# Patient Record
Sex: Female | Born: 1985
Health system: Southern US, Community
[De-identification: ages and names within clinical notes are randomized; demographics above are authoritative.]

## PROBLEM LIST (undated history)

## (undated) DIAGNOSIS — D270 Benign neoplasm of right ovary: Secondary | ICD-10-CM

## (undated) DIAGNOSIS — K5900 Constipation, unspecified: Secondary | ICD-10-CM

## (undated) DIAGNOSIS — Z8742 Personal history of other diseases of the female genital tract: Secondary | ICD-10-CM

## (undated) DIAGNOSIS — G4700 Insomnia, unspecified: Secondary | ICD-10-CM

## (undated) DIAGNOSIS — E785 Hyperlipidemia, unspecified: Secondary | ICD-10-CM

## (undated) DIAGNOSIS — R519 Headache, unspecified: Secondary | ICD-10-CM

## (undated) DIAGNOSIS — Z87898 Personal history of other specified conditions: Secondary | ICD-10-CM

## (undated) HISTORY — PX: BREAST SURGERY: SHX581

---

## 2015-06-11 HISTORY — PX: OTHER SURGICAL HISTORY: SHX169

## 2019-12-08 ENCOUNTER — Other Ambulatory Visit: Payer: Self-pay | Admitting: Chiropractor

## 2019-12-08 ENCOUNTER — Ambulatory Visit
Admission: RE | Admit: 2019-12-08 | Discharge: 2019-12-08 | Disposition: A | Discharge status: Home / Self Care | Payer: Managed Care, Other (non HMO) | Source: Ambulatory Visit | Attending: Chiropractor | Admitting: Chiropractor

## 2019-12-08 ENCOUNTER — Other Ambulatory Visit: Payer: Self-pay

## 2019-12-08 DIAGNOSIS — M25521 Pain in right elbow: Secondary | ICD-10-CM | POA: Diagnosis not present

## 2019-12-08 DIAGNOSIS — R52 Pain, unspecified: Secondary | ICD-10-CM

## 2019-12-08 DIAGNOSIS — M545 Low back pain: Secondary | ICD-10-CM | POA: Diagnosis not present

## 2019-12-08 DIAGNOSIS — M546 Pain in thoracic spine: Secondary | ICD-10-CM | POA: Diagnosis not present

## 2019-12-08 DIAGNOSIS — Y929 Unspecified place or not applicable: Secondary | ICD-10-CM | POA: Diagnosis not present

## 2020-11-24 ENCOUNTER — Other Ambulatory Visit: Payer: Self-pay

## 2020-11-24 ENCOUNTER — Emergency Department
Admission: EM | Admit: 2020-11-24 | Discharge: 2020-11-24 | Disposition: A | Discharge status: Home / Self Care | Payer: 59 | Attending: Emergency Medicine | Admitting: Emergency Medicine

## 2020-11-24 ENCOUNTER — Emergency Department: Payer: 59

## 2020-11-24 ENCOUNTER — Encounter: Payer: Self-pay | Admitting: Emergency Medicine

## 2020-11-24 DIAGNOSIS — F419 Anxiety disorder, unspecified: Secondary | ICD-10-CM

## 2020-11-24 DIAGNOSIS — R519 Headache, unspecified: Secondary | ICD-10-CM | POA: Diagnosis not present

## 2020-11-24 DIAGNOSIS — R079 Chest pain, unspecified: Secondary | ICD-10-CM

## 2020-11-24 DIAGNOSIS — R072 Precordial pain: Secondary | ICD-10-CM | POA: Insufficient documentation

## 2020-11-24 LAB — COMPREHENSIVE METABOLIC PANEL
ALT: 12 U/L (ref 0–44)
AST: 19 U/L (ref 15–41)
Albumin: 3.8 g/dL (ref 3.5–5.0)
Alkaline Phosphatase: 45 U/L (ref 38–126)
Anion gap: 12 (ref 5–15)
BUN: 9 mg/dL (ref 6–20)
CO2: 20 mmol/L — ABNORMAL LOW (ref 22–32)
Calcium: 8.9 mg/dL (ref 8.9–10.3)
Chloride: 106 mmol/L (ref 98–111)
Creatinine, Ser: 0.72 mg/dL (ref 0.44–1.00)
GFR, Estimated: >60 mL/min (ref >60)
Glucose, Bld: 109 mg/dL — ABNORMAL HIGH (ref 70–99)
Potassium: 3.8 mmol/L (ref 3.5–5.1)
Sodium: 138 mmol/L (ref 135–145)
Total Bilirubin: 0.6 mg/dL (ref 0.3–1.2)
Total Protein: 7.8 g/dL (ref 6.5–8.1)

## 2020-11-24 LAB — CBC WITH DIFFERENTIAL/PLATELET
Abs Immature Granulocytes: 0.01 10*3/uL (ref 0.00–0.07)
Basophils Absolute: 0 10*3/uL (ref 0.0–0.1)
Basophils Relative: 0 %
Eosinophils Absolute: 0.1 10*3/uL (ref 0.0–0.5)
Eosinophils Relative: 2 %
HCT: 37.7 % (ref 36.0–46.0)
Hemoglobin: 12.3 g/dL (ref 12.0–15.0)
Immature Granulocytes: 0 %
Lymphocytes Relative: 31 %
Lymphs Abs: 1.6 10*3/uL (ref 0.7–4.0)
MCH: 26.7 pg (ref 26.0–34.0)
MCHC: 32.6 g/dL (ref 30.0–36.0)
MCV: 82 fL (ref 80.0–100.0)
Monocytes Absolute: 0.3 10*3/uL (ref 0.1–1.0)
Monocytes Relative: 6 %
Neutro Abs: 3.1 10*3/uL (ref 1.7–7.7)
Neutrophils Relative %: 61 %
Platelets: 299 10*3/uL (ref 150–400)
RBC: 4.6 MIL/uL (ref 3.87–5.11)
RDW: 13.3 % (ref 11.5–15.5)
WBC: 5.1 10*3/uL (ref 4.0–10.5)
nRBC: 0 % (ref 0.0–0.2)

## 2020-11-24 LAB — TROPONIN I (HIGH SENSITIVITY)
Troponin I (High Sensitivity): <2 ng/L (ref <18)
Troponin I (High Sensitivity): <2 ng/L (ref <18)

## 2020-11-24 MED ORDER — ACETAMINOPHEN 500 MG PO TABS
ORAL_TABLET | ORAL | Status: AC
  Filled 2020-11-24: qty 2

## 2020-11-24 MED ORDER — IBUPROFEN 400 MG PO TABS
400.0000 mg | ORAL_TABLET | Freq: Once | ORAL | Status: AC
  Administered 2020-11-24: 400 mg via ORAL
  Filled 2020-11-24: qty 1

## 2020-11-24 MED ORDER — ACETAMINOPHEN 500 MG PO TABS
1000.0000 mg | ORAL_TABLET | Freq: Once | ORAL | Status: AC
  Administered 2020-11-24: 1000 mg via ORAL

## 2020-11-24 NOTE — ED Provider Notes (Signed)
[facility] Emergency Department Provider Note  ____________________________________________   Event Date/Time   First MD Initiated Contact with Patient 11/24/20 1209     (approximate)  I have reviewed the triage vital signs and the nursing notes.   HISTORY  Chief Complaint Chest Pain   HPI Dakiyah Juhasz is a 34 y.o. female without significant past medical history who presents for assessment of approximately 4 days of some mild headache and some substernal and left-sided chest tightness.  Patient states she is under a lot of stress because she is going to exam issues with her family and has a family history of heart disease and wanted to get her heart checked out.  She states she tried some CBD but this is not helped too much.  She denies any earache, vision changes, vertigo, cough, shortness of breath, vomiting, diarrhea, dysuria, rash abdominal pain, back pain, extremity pain, urinary symptoms or other acute complaints.  Denies tobacco abuse, EtOH use, illicit drug use.  No personal episodes.  No alleviating aggravating factors.         History reviewed. No pertinent past medical history.  There are no problems to display for this patient.   History reviewed. No pertinent surgical history.  Prior to Admission medications   Not on File    Allergies Patient has no known allergies.  No family history on file.  Social History Social History   Tobacco Use  . Smoking status: Never Smoker  . Smokeless tobacco: Never Used  Vaping Use  . Vaping Use: Never used    Review of Systems  Review of Systems  Constitutional: Negative for chills and fever.  HENT: Positive for congestion. Negative for sore throat.   Eyes: Negative for pain.  Respiratory: Negative for cough and stridor.   Cardiovascular: Positive for chest pain.  Gastrointestinal: Negative for vomiting.  Genitourinary: Negative for dysuria.  Musculoskeletal: Negative for myalgias.   Skin: Negative for rash.  Neurological: Positive for headaches. Negative for seizures and loss of consciousness.  Psychiatric/Behavioral: Negative for suicidal ideas. The patient is nervous/anxious.   All other systems reviewed and are negative.     ____________________________________________   PHYSICAL EXAM:  VITAL SIGNS: ED Triage Vitals  Enc Vitals Group     BP 11/24/20 0436 (!) 143/90     Pulse Rate 11/24/20 0436 82     Resp 11/24/20 0436 18     Temp 11/24/20 0436 98 F (36.7 C)     Temp Source 11/24/20 0436 Oral     SpO2 11/24/20 0436 100 %     Weight 11/24/20 0432 165 lb (74.8 kg)     Height 11/24/20 0432 5' (1.524 m)     Head Circumference --      Peak Flow --      Pain Score 11/24/20 0432 6     Pain Loc --      Pain Edu? --      Excl. in GC? --    Vitals:   11/24/20 1110 11/24/20 1217  BP: (!) 143/84 (!) 145/97  Pulse: 84 96  Resp: 19 19  Temp: 98 F (36.7 C)   SpO2: 99% 100%   Physical Exam Vitals and nursing note reviewed.  Constitutional:      General: She is not in acute distress.    Appearance: She is well-developed and well-nourished.  HENT:     Head: Normocephalic and atraumatic.     Right Ear: External ear normal.  Left Ear: External ear normal.     Nose: Nose normal.     Mouth/Throat:     Mouth: Mucous membranes are moist.  Eyes:     Conjunctiva/sclera: Conjunctivae normal.  Cardiovascular:     Rate and Rhythm: Normal rate and regular rhythm.     Heart sounds: No murmur heard.   Pulmonary:     Effort: Pulmonary effort is normal. No respiratory distress.     Breath sounds: Normal breath sounds.  Abdominal:     Palpations: Abdomen is soft.     Tenderness: There is no abdominal tenderness.  Musculoskeletal:        General: No edema.     Cervical back: Neck supple.     Right lower leg: No edema.     Left lower leg: No edema.  Skin:    General: Skin is warm and dry.     Capillary Refill: Capillary refill takes less than 2  seconds.  Neurological:     Mental Status: She is alert and oriented to person, place, and time.  Psychiatric:        Mood and Affect: Mood is anxious.        Thought Content: Thought content does not include homicidal or suicidal ideation.     Cranial nerves II to XII grossly intact.  Patient symmetric strength in extremities and is able to ambulate with steady gait unassisted.  ____________________________________________   LABS (all labs ordered are listed, but only abnormal results are displayed)  Labs Reviewed  COMPREHENSIVE METABOLIC PANEL - Abnormal; Notable for the following components:      Result Value   CO2 20 (*)    Glucose, Bld 109 (*)    All other components within normal limits  CBC WITH DIFFERENTIAL/PLATELET  TROPONIN I (HIGH SENSITIVITY)  TROPONIN I (HIGH SENSITIVITY)   ____________________________________________  EKG  Sinus rhythm with a ventricular rate of 92, normal axis, unremarkable intervals, no evidence of acute ischemia or significant underlying arrhythmia ____________________________________________  RADIOLOGY  ED MD interpretation: No focal consolidation, pneumothorax, large edema, overt effusion, or acute intrathoracic process  Official radiology report(s): DG Chest 2 View  Result Date: 11/24/2020 CLINICAL DATA:  34 year old female with chest pain, headache for several days. EXAM: CHEST - 2 VIEW COMPARISON:  Thoracic spine radiographs 12/08/2019. FINDINGS: Mild thoracolumbar scoliosis appears stable since last year. Normal lung volumes and mediastinal contours. Visualized tracheal air column is within normal limits. EKG button artifact in the right upper chest. Both lungs appear clear. No pneumothorax or pleural effusion. Negative visible bowel gas pattern. No acute osseous abnormality identified. IMPRESSION: Negative.  No cardiopulmonary abnormality. Electronically Signed   By: Odessa Fleming M.D.   On: 11/24/2020 05:30     ____________________________________________   PROCEDURES  Procedure(s) performed (including Critical Care):  Procedures   ____________________________________________   INITIAL IMPRESSION / ASSESSMENT AND PLAN / ED COURSE        Patient presents for assessment of some chest pain, headache, congestion, and anxiety as described above.  On arrival she is hypertensive with a BP of 145/97 with stable vital signs on room air.  Differential) not limited to ACS, PE, pneumonia, anemia, pericarditis, hepatitis, GERD, and chest wall pain.  No findings on history or exam to suggest meningitis (infection in the head or neck i.e. no fever, neck stiffness, elevated white blood cell, or abnormal findings on exam.  ECG shows no evidence of arrhythmia or ischemia.  Low suspicion for ACS given 2 nonelevated troponins obtained  over 2 hours.  Chest x-ray shows no evidence of pneumonia or other acute thoracic process.  Troponins not consistent with myocarditis.  CBC is unremarkable for evidence of acute anemia or leukocytosis.  BMP shows no significant metabolic derangements.  Impression is possible viral syndrome versus chest wall pain have a low suspicion for life-threatening etiology at this time.  Advised patient to take any Tylenol ibuprofen   ____________________________________________   FINAL CLINICAL IMPRESSION(S) / ED DIAGNOSES  Final diagnoses:  Chest pain, unspecified type  Anxiety    Medications  acetaminophen (TYLENOL) 500 MG tablet (  Not Given 11/24/20 1231)  acetaminophen (TYLENOL) tablet 1,000 mg (1,000 mg Oral Given 11/24/20 1229)  ibuprofen (ADVIL) tablet 400 mg (400 mg Oral Given 11/24/20 1230)     ED Discharge Orders    None       Note:  This document was prepared using Dragon voice recognition software and may include unintentional dictation errors.   Gilles Chiquito, MD 11/24/20 1259

## 2020-11-24 NOTE — ED Triage Notes (Signed)
Patient ambulatory to triage with steady gait, without difficulty or distress noted; pt reports mid CP, nonradiating accomp by HA for several days

## 2020-11-24 NOTE — ED Notes (Addendum)
Pt in with HA, stress, and chest discomfort. Reports wanted to get checked out because of a family history with cardiac issues. Pt alert and in NAD; resting calmly in bed. Somewhat tearful; pt admits being mildly anxious. Skin dry. Resp reg/unlabored. Reports attempted to calm down with CBD but had no relief.
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
## 2023-04-26 ENCOUNTER — Other Ambulatory Visit: Payer: Self-pay | Admitting: Obstetrics and Gynecology

## 2023-04-27 ENCOUNTER — Encounter
Admission: RE | Admit: 2023-04-27 | Discharge: 2023-04-27 | Disposition: A | Discharge status: Home / Self Care | Payer: 59 | Source: Ambulatory Visit | Attending: Obstetrics and Gynecology | Admitting: Obstetrics and Gynecology

## 2023-04-27 VITALS — Ht 60.0 in | Wt 165.3 lb

## 2023-04-27 DIAGNOSIS — Z01818 Encounter for other preprocedural examination: Secondary | ICD-10-CM

## 2023-04-27 HISTORY — DX: Personal history of other diseases of the female genital tract: Z87.42

## 2023-04-27 HISTORY — DX: Hyperlipidemia, unspecified: E78.5

## 2023-04-27 HISTORY — DX: Constipation, unspecified: K59.00

## 2023-04-27 HISTORY — DX: Benign neoplasm of right ovary: D27.0

## 2023-04-27 HISTORY — DX: Personal history of other specified conditions: Z87.898

## 2023-04-27 HISTORY — DX: Insomnia, unspecified: G47.00

## 2023-04-27 HISTORY — DX: Headache, unspecified: R51.9

## 2023-04-27 NOTE — Patient Instructions (Signed)
Your procedure is scheduled on:05-03-23 Thursday Report to the Registration Desk on the 1st floor of the [facility].Then proceed to the 2nd floor Surgery Desk To find out your arrival time, please call [phone] between 1PM - 3PM on:05-02-23 Wednesday If your arrival time is 6:00 am, do not arrive before that time as the [facility] entrance doors do not open until 6:00 am.  REMEMBER: Instructions that are not followed completely may result in serious medical risk, up to and including death; or upon the discretion of your surgeon and anesthesiologist your surgery may need to be rescheduled.  Do not eat food after midnight the night before surgery.  No gum chewing or hard candies.  You may however, drink CLEAR liquids up to 2 hours before you are scheduled to arrive for your surgery. Do not drink anything within 2 hours of your scheduled arrival time.  Clear liquids include: - water  - apple juice without pulp - gatorade (not RED colors) - black coffee or tea (Do NOT add milk or creamers to the coffee or tea) Do NOT drink anything that is not on this list.  One week prior to surgery: Stop Anti-inflammatories (NSAIDS) such as Advil, Aleve, Ibuprofen, Motrin, Naproxen, Naprosyn and Aspirin based products such as Excedrin, Goody's Powder, BC Powder.You may however, take Tylenol if needed for pain up until the day of surgery. Stop ANY OVER THE COUNTER supplements/vitamins NOW (04-27-23) until after surgery.  Do NOT take any medication the day of surgery  No Alcohol for 24 hours before or after surgery.  No Smoking including e-cigarettes for 24 hours before surgery.  No chewable tobacco products for at least 6 hours before surgery.  No nicotine patches on the day of surgery.  Do not use any "recreational" drugs for at least a week (preferably 2 weeks) before your surgery.  Please be advised that the combination of cocaine and anesthesia may have negative outcomes, up to and including  death. If you test positive for cocaine, your surgery will be cancelled.  On the morning of surgery brush your teeth with toothpaste and water, you may rinse your mouth with mouthwash if you wish. Do not swallow any toothpaste or mouthwash.  Use CHG Soap as directed on instruction sheet.  Do not wear jewelry, make-up, hairpins, clips or nail polish.  Do not wear lotions, powders, or perfumes.   Do not shave body hair from the neck down 48 hours before surgery.  Contact lenses, hearing aids and dentures may not be worn into surgery.  Do not bring valuables to the hospital. [facility] is not responsible for any missing/lost belongings or valuables.   Notify your doctor if there is any change in your medical condition (cold, fever, infection).  Wear comfortable clothing (specific to your surgery type) to the hospital.  After surgery, you can help prevent lung complications by doing breathing exercises.  Take deep breaths and cough every 1-2 hours. Your doctor may order a device called an Incentive Spirometer to help you take deep breaths. When coughing or sneezing, hold a pillow firmly against your incision with both hands. This is called "splinting." Doing this helps protect your incision. It also decreases belly discomfort.  If you are being admitted to the hospital overnight, leave your suitcase in the car. After surgery it may be brought to your room.  In case of increased patient census, it may be necessary for you, the patient, to continue your postoperative care in the Same Day Surgery  department.  If you are being discharged the day of surgery, you will not be allowed to drive home. You will need a responsible individual to drive you home and stay with you for 24 hours after surgery.   If you are taking public transportation, you will need to have a responsible individual with you.  Please call the Pre-admissions Testing Dept. at [phone] if you have any questions about  these instructions.  Surgery Visitation Policy:  Patients having surgery or a procedure may have two visitors.  Children under the age of 78 must have an adult with them who is not the patient.     Preparing for Surgery with CHLORHEXIDINE GLUCONATE (CHG) Soap  Chlorhexidine Gluconate (CHG) Soap  o An antiseptic cleaner that kills germs and bonds with the skin to continue killing germs even after washing  o Used for showering the night before surgery and morning of surgery  Before surgery, you can play an important role by reducing the number of germs on your skin.  CHG (Chlorhexidine gluconate) soap is an antiseptic cleanser which kills germs and bonds with the skin to continue killing germs even after washing.  Please do not use if you have an allergy to CHG or antibacterial soaps. If your skin becomes reddened/irritated stop using the CHG.  1. Shower the NIGHT BEFORE SURGERY and the MORNING OF SURGERY with CHG soap.  2. If you choose to wash your hair, wash your hair first as usual with your normal shampoo.  3. After shampooing, rinse your hair and body thoroughly to remove the shampoo.  4. Use CHG as you would any other liquid soap. You can apply CHG directly to the skin and wash gently with a scrungie or a clean washcloth.  5. Apply the CHG soap to your body only from the neck down. Do not use on open wounds or open sores. Avoid contact with your eyes, ears, mouth, and genitals (private parts). Wash face and genitals (private parts) with your normal soap.  6. Wash thoroughly, paying special attention to the area where your surgery will be performed.  7. Thoroughly rinse your body with warm water.  8. Do not shower/wash with your normal soap after using and rinsing off the CHG soap.  9. Pat yourself dry with a clean towel.  10. Wear clean pajamas to bed the night before surgery.  12. Place clean sheets on your bed the night of your first shower and do not sleep with  pets.  13. Shower again with the CHG soap on the day of surgery prior to arriving at the hospital.  14. Do not apply any deodorants/lotions/powders.  15. Please wear clean clothes to the hospital.

## 2023-04-27 NOTE — H&P (Signed)
Chief Complaint:   Beth Apple Chaneka Pittman is a 37 y.o. female presenting with acute signficant pelvic pain    History of Present Illness: She was recently seen by her PCP for back pain that radiates to her pelvic area. She was sent for a CT and was confirmed to have a mature teratoma on her right ovary that measures 7 cm. She was then referred to GYN for further workup/evaluation. She reports that she has been taking Ibuprofen around the clock daily for the past few weeks without relief.  She is tearful today in clinic during her physical d/t to concern of diagnosis and pain.  Uterus: Anteverted Uterus= 9.47 x 3.73 x 5.06cm   Endometrium= 8.86mm   Right ovary appears to contain a dermoid, cannot visualize ovarian tissue - 6.97 x 5.76 x 8.28cm - superior to uterine position and causing mass effect to the left pelvis - cannot r/o ovarian torsion  Left ovary appears wnl Rt/Lt Adnexa appear wnl      Past Medical History:  has a past medical history of Constipation, Headache, History of abnormal cervical Pap smear, History of palpitations, HLD (hyperlipidemia), Insomnia, and Teratoma of ovary, right.  Past Surgical History:  has a past surgical history that includes Breast surgery; Cesarean section; and REDUCTION MAMMAPLASTY Bilateral (06/2015). Family History: family history is not on file. Social History:  reports that she has never smoked. She has never used smokeless tobacco. She reports current alcohol use. She reports that she does not use drugs. OB/GYN History:  OB History   No obstetric history on file.    Allergies: has No Known Allergies. Medications:No current facility-administered medications for this encounter. No current outpatient medications on file.  Review of Systems: No SOB, no palpitations or chest pain, no new lower extremity edema, no nausea or vomiting or bowel or bladder complaints. See HPI for gyn specific ROS.   Exam:   LMP 04/02/2023 (Exact Date)    General: Patient is well-groomed, well-nourished, appears stated age in no acute distress   HEENT: head is atraumatic and normocephalic, trachea is midline, neck is supple with no palpable nodules   CV: Regular rhythm and normal heart rate, no murmur   Pulm: Clear to auscultation throughout lung fields with no wheezing, crackles, or rhonchi. No increased work of breathing  Abdomen: soft , no mass, non-tender, no rebound tenderness, no hepatomegaly  Pelvic: tanner stage 5 ,   External genitalia: vulva /labia no lesions  Urethra: no prolapse  Vagina: normal physiologic d/c, laxity in vaginal walls  Cervix: no lesions, no cervical motion tenderness, good descent  Uterus: normal size shape and contour, non-tender  Adnexa: no mass,  non-tender    Rectovaginal: External wnl   Impression:   There were no encounter diagnoses.    Plan:    Patient returns for a preoperative discussion regarding her plans to proceed with surgical treatment of her dermoid cyst by laparoscopic bilateral ovarian cystectomy and possible R oophorectomy procedure.  We may perform a cystoscopy to evaluate the urinary tract after the procedure, if surgically indicated for uro tract integrity.   The patient and I discussed the technical aspects of the procedure including the potential for risks and complications.  These include but are not limited to the risk of infection requiring post-operative antibiotics or further procedures.  We talked about the risk of injury to adjacent organs including bladder, bowel, ureter, blood vessels or nerves.  We talked about the need to convert to an open incision.  We talked about the possible need for blood transfusion.  We talked about postop complications such as thromboembolic or cardiopulmonary complications.  All of her questions were answered.  Her preoperative exam was completed and the appropriate consents were signed. She is scheduled to undergo this procedure in the near  future.  Return for postop    [name], MD

## 2023-04-30 ENCOUNTER — Encounter
Admission: RE | Admit: 2023-04-30 | Discharge: 2023-04-30 | Disposition: A | Discharge status: Home / Self Care | Payer: 59 | Source: Ambulatory Visit | Attending: Obstetrics and Gynecology | Admitting: Obstetrics and Gynecology

## 2023-04-30 DIAGNOSIS — Z01818 Encounter for other preprocedural examination: Secondary | ICD-10-CM

## 2023-04-30 DIAGNOSIS — Z01812 Encounter for preprocedural laboratory examination: Secondary | ICD-10-CM | POA: Diagnosis present

## 2023-04-30 LAB — TYPE AND SCREEN
ABO/RH(D): O POS
Antibody Screen: NEGATIVE

## 2023-05-02 MED ORDER — LACTATED RINGERS IV SOLN: INTRAVENOUS | Status: DC

## 2023-05-02 MED ORDER — ACETAMINOPHEN 500 MG PO TABS
1000.0000 mg | ORAL_TABLET | ORAL | Status: AC
  Administered 2023-05-03: 1000 mg via ORAL

## 2023-05-02 MED ORDER — POVIDONE-IODINE 10 % EX SWAB
2.0000 | Freq: Once | CUTANEOUS | Status: AC
  Administered 2023-05-03: 2 via TOPICAL

## 2023-05-02 MED ORDER — ORAL CARE MOUTH RINSE: 15.0000 mL | Freq: Once | OROMUCOSAL | Status: DC

## 2023-05-02 MED ORDER — CHLORHEXIDINE GLUCONATE 0.12 % MT SOLN: 15.0000 mL | Freq: Once | OROMUCOSAL | Status: DC

## 2023-05-02 MED ORDER — FAMOTIDINE 20 MG PO TABS
20.0000 mg | ORAL_TABLET | Freq: Once | ORAL | Status: AC
  Administered 2023-05-03: 20 mg via ORAL

## 2023-05-02 MED ORDER — GABAPENTIN 300 MG PO CAPS
300.0000 mg | ORAL_CAPSULE | ORAL | Status: AC
  Administered 2023-05-03: 300 mg via ORAL

## 2023-05-03 ENCOUNTER — Encounter: Payer: Self-pay | Admitting: Obstetrics and Gynecology

## 2023-05-03 ENCOUNTER — Ambulatory Visit: Payer: 59

## 2023-05-03 ENCOUNTER — Other Ambulatory Visit: Payer: Self-pay

## 2023-05-03 ENCOUNTER — Encounter
Admission: RE | Disposition: A | Discharge status: Home / Self Care | Payer: Self-pay | Source: Home / Self Care | Attending: Obstetrics and Gynecology

## 2023-05-03 ENCOUNTER — Ambulatory Visit
Admission: RE | Admit: 2023-05-03 | Discharge: 2023-05-03 | Disposition: A | Discharge status: Home / Self Care | Payer: 59 | Attending: Obstetrics and Gynecology | Admitting: Obstetrics and Gynecology

## 2023-05-03 DIAGNOSIS — D270 Benign neoplasm of right ovary: Secondary | ICD-10-CM | POA: Insufficient documentation

## 2023-05-03 DIAGNOSIS — E785 Hyperlipidemia, unspecified: Secondary | ICD-10-CM | POA: Diagnosis not present

## 2023-05-03 DIAGNOSIS — R102 Pelvic and perineal pain: Secondary | ICD-10-CM | POA: Diagnosis present

## 2023-05-03 DIAGNOSIS — Z01818 Encounter for other preprocedural examination: Secondary | ICD-10-CM

## 2023-05-03 HISTORY — PX: ROBOTIC ASSISTED LAPAROSCOPIC OVARIAN CYSTECTOMY: SHX6081

## 2023-05-03 LAB — POCT PREGNANCY, URINE: Preg Test, Ur: NEGATIVE

## 2023-05-03 LAB — ABO/RH: ABO/RH(D): O POS

## 2023-05-03 SURGERY — EXCISION, CYST, OVARY, ROBOT-ASSISTED, LAPAROSCOPIC
Anesthesia: General | Laterality: Bilateral

## 2023-05-03 MED ORDER — SUGAMMADEX SODIUM 200 MG/2ML IV SOLN
INTRAVENOUS | Status: DC | PRN
  Administered 2023-05-03: 200 mg via INTRAVENOUS

## 2023-05-03 MED ORDER — KETOROLAC TROMETHAMINE 30 MG/ML IJ SOLN
INTRAMUSCULAR | Status: DC | PRN
  Administered 2023-05-03: 30 mg via INTRAVENOUS

## 2023-05-03 MED ORDER — LIDOCAINE HCL (CARDIAC) PF 100 MG/5ML IV SOSY
PREFILLED_SYRINGE | INTRAVENOUS | Status: DC | PRN
  Administered 2023-05-03: 100 mg via INTRAVENOUS

## 2023-05-03 MED ORDER — PROPOFOL 10 MG/ML IV BOLUS
INTRAVENOUS | Status: AC
  Filled 2023-05-03: qty 20

## 2023-05-03 MED ORDER — KETOROLAC TROMETHAMINE 30 MG/ML IJ SOLN
INTRAMUSCULAR | Status: AC
  Filled 2023-05-03: qty 1

## 2023-05-03 MED ORDER — OXYCODONE HCL 5 MG/5ML PO SOLN: 5.0000 mg | Freq: Once | ORAL | Status: AC | PRN

## 2023-05-03 MED ORDER — FENTANYL CITRATE (PF) 100 MCG/2ML IJ SOLN
INTRAMUSCULAR | Status: AC
  Filled 2023-05-03: qty 2

## 2023-05-03 MED ORDER — OXYCODONE HCL 5 MG PO TABS
5.0000 mg | ORAL_TABLET | Freq: Once | ORAL | Status: AC | PRN
  Administered 2023-05-03: 5 mg via ORAL

## 2023-05-03 MED ORDER — FENTANYL CITRATE (PF) 100 MCG/2ML IJ SOLN
25.0000 ug | INTRAMUSCULAR | Status: AC | PRN
  Administered 2023-05-03 (×3): 25 ug via INTRAVENOUS
  Administered 2023-05-03: 50 ug via INTRAVENOUS
  Administered 2023-05-03: 25 ug via INTRAVENOUS
  Administered 2023-05-03: 50 ug via INTRAVENOUS

## 2023-05-03 MED ORDER — PROPOFOL 10 MG/ML IV BOLUS
INTRAVENOUS | Status: DC | PRN
  Administered 2023-05-03: 150 mg via INTRAVENOUS
  Administered 2023-05-03 (×2): 50 ug/kg/min via INTRAVENOUS

## 2023-05-03 MED ORDER — FENTANYL CITRATE (PF) 100 MCG/2ML IJ SOLN
INTRAMUSCULAR | Status: DC | PRN
  Administered 2023-05-03 (×2): 50 ug via INTRAVENOUS

## 2023-05-03 MED ORDER — ACETAMINOPHEN 500 MG PO TABS
ORAL_TABLET | ORAL | Status: AC
  Filled 2023-05-03: qty 2

## 2023-05-03 MED ORDER — BUPIVACAINE HCL (PF) 0.5 % IJ SOLN
INTRAMUSCULAR | Status: DC | PRN
  Administered 2023-05-03: 10 mL

## 2023-05-03 MED ORDER — ACETAMINOPHEN 10 MG/ML IV SOLN
INTRAVENOUS | Status: AC
  Filled 2023-05-03: qty 100

## 2023-05-03 MED ORDER — MIDAZOLAM HCL 2 MG/2ML IJ SOLN
INTRAMUSCULAR | Status: DC | PRN
  Administered 2023-05-03: 2 mg via INTRAVENOUS

## 2023-05-03 MED ORDER — FAMOTIDINE 20 MG PO TABS
ORAL_TABLET | ORAL | Status: AC
  Filled 2023-05-03: qty 1

## 2023-05-03 MED ORDER — CHLORHEXIDINE GLUCONATE 0.12 % MT SOLN
OROMUCOSAL | Status: AC
  Filled 2023-05-03: qty 15

## 2023-05-03 MED ORDER — ROCURONIUM BROMIDE 10 MG/ML (PF) SYRINGE
PREFILLED_SYRINGE | INTRAVENOUS | Status: AC
  Filled 2023-05-03: qty 10

## 2023-05-03 MED ORDER — OXYCODONE HCL 5 MG PO TABS
ORAL_TABLET | ORAL | Status: AC
  Filled 2023-05-03: qty 1

## 2023-05-03 MED ORDER — 0.9 % SODIUM CHLORIDE (POUR BTL) OPTIME
TOPICAL | Status: DC | PRN
  Administered 2023-05-03: 500 mL

## 2023-05-03 MED ORDER — ACETAMINOPHEN EXTRA STRENGTH 500 MG PO TABS: 1000.0000 mg | ORAL_TABLET | Freq: Four times a day (QID) | ORAL | 0 refills | Status: AC

## 2023-05-03 MED ORDER — MIDAZOLAM HCL 2 MG/2ML IJ SOLN
INTRAMUSCULAR | Status: AC
  Filled 2023-05-03: qty 2

## 2023-05-03 MED ORDER — CEFAZOLIN SODIUM-DEXTROSE 2-4 GM/100ML-% IV SOLN
INTRAVENOUS | Status: AC
  Filled 2023-05-03: qty 100

## 2023-05-03 MED ORDER — IBUPROFEN 800 MG PO TABS: 800.0000 mg | ORAL_TABLET | Freq: Three times a day (TID) | ORAL | 1 refills | Status: AC

## 2023-05-03 MED ORDER — OXYCODONE HCL 5 MG PO TABS: 5.0000 mg | ORAL_TABLET | ORAL | 0 refills | Status: AC | PRN

## 2023-05-03 MED ORDER — ROCURONIUM BROMIDE 100 MG/10ML IV SOLN
INTRAVENOUS | Status: DC | PRN
  Administered 2023-05-03: 30 mg via INTRAVENOUS
  Administered 2023-05-03: 50 mg via INTRAVENOUS

## 2023-05-03 MED ORDER — BUPIVACAINE HCL (PF) 0.5 % IJ SOLN
INTRAMUSCULAR | Status: AC
  Filled 2023-05-03: qty 30

## 2023-05-03 MED ORDER — ONDANSETRON HCL 4 MG/2ML IJ SOLN
INTRAMUSCULAR | Status: DC | PRN
  Administered 2023-05-03: 4 mg via INTRAVENOUS

## 2023-05-03 MED ORDER — DOCUSATE SODIUM 100 MG PO CAPS: 100.0000 mg | ORAL_CAPSULE | Freq: Two times a day (BID) | ORAL | 0 refills | Status: AC

## 2023-05-03 MED ORDER — DEXAMETHASONE SODIUM PHOSPHATE 10 MG/ML IJ SOLN
INTRAMUSCULAR | Status: DC | PRN
  Administered 2023-05-03: 10 mg via INTRAVENOUS

## 2023-05-03 MED ORDER — GABAPENTIN 300 MG PO CAPS
ORAL_CAPSULE | ORAL | Status: AC
  Filled 2023-05-03: qty 1

## 2023-05-03 SURGICAL SUPPLY — 65 items
ADH SKN CLS APL DERMABOND .7 (GAUZE/BANDAGES/DRESSINGS) ×2
ANCHOR TIS RET SYS 235ML (MISCELLANEOUS) IMPLANT
APL SRG 38 LTWT LNG FL B (MISCELLANEOUS)
APPLICATOR ARISTA FLEXITIP XL (MISCELLANEOUS) IMPLANT
BAG DRN RND TRDRP ANRFLXCHMBR (UROLOGICAL SUPPLIES) ×1
BAG LAPAROSCOPIC 12 15 PORT 16 (BASKET) IMPLANT
BAG RETRIEVAL 12/15 (BASKET) ×1
BAG TISS RTRVL C235 10X14 (MISCELLANEOUS) ×1
BAG URINE DRAIN 2000ML AR STRL (UROLOGICAL SUPPLIES) ×1 IMPLANT
BLADE SURG SZ11 CARB STEEL (BLADE) ×1 IMPLANT
CATH FOLEY 2WAY  5CC 16FR (CATHETERS) ×1
CATH FOLEY 2WAY 5CC 16FR (CATHETERS) ×1
CATH URTH 16FR FL 2W BLN LF (CATHETERS) ×1 IMPLANT
COUNTER NEEDLE 20/40 LG (NEEDLE) ×1 IMPLANT
COVER TIP SHEARS 8 DVNC (MISCELLANEOUS) ×1 IMPLANT
COVER WAND RF STERILE (DRAPES) ×1 IMPLANT
DERMABOND ADVANCED .7 DNX12 (GAUZE/BANDAGES/DRESSINGS) ×1 IMPLANT
DRAPE 3/4 80X56 (DRAPES) ×1 IMPLANT
DRAPE ARM DVNC X/XI (DISPOSABLE) ×3 IMPLANT
DRAPE COLUMN DVNC XI (DISPOSABLE) ×1 IMPLANT
DRAPE ROBOT W/ LEGGING 30X125 (DRAPES) ×1 IMPLANT
ELECT REM PT RETURN 9FT ADLT (ELECTROSURGICAL) ×1
ELECTRODE REM PT RTRN 9FT ADLT (ELECTROSURGICAL) ×1 IMPLANT
FORCEPS BPLR FENES DVNC XI (FORCEP) ×1 IMPLANT
GLOVE BIO SURGEON STRL SZ7 (GLOVE) ×4 IMPLANT
GLOVE INDICATOR 7.5 STRL GRN (GLOVE) ×4 IMPLANT
GOWN STRL REUS W/ TWL LRG LVL3 (GOWN DISPOSABLE) ×4 IMPLANT
GOWN STRL REUS W/TWL LRG LVL3 (GOWN DISPOSABLE) ×4
GRASPER SUT TROCAR 14GX15 (MISCELLANEOUS) ×1 IMPLANT
HANDLE YANKAUER SUCT BULB TIP (MISCELLANEOUS) IMPLANT
HEMOSTAT ARISTA ABSORB 3G PWDR (HEMOSTASIS) IMPLANT
IRRIGATION STRYKERFLOW (MISCELLANEOUS) IMPLANT
IRRIGATOR STRYKERFLOW (MISCELLANEOUS)
IV NS 1000ML (IV SOLUTION)
IV NS 1000ML BAXH (IV SOLUTION) IMPLANT
KIT PINK PAD W/HEAD ARE REST (MISCELLANEOUS) ×1
KIT PINK PAD W/HEAD ARM REST (MISCELLANEOUS) ×1 IMPLANT
LABEL OR SOLS (LABEL) ×1 IMPLANT
MANIFOLD NEPTUNE II (INSTRUMENTS) ×1 IMPLANT
MANIPULATOR UTERINE 4.5 ZUMI (MISCELLANEOUS) ×1 IMPLANT
NDL DRIVE SUT CUT DVNC (INSTRUMENTS) ×1 IMPLANT
NEEDLE DRIVE SUT CUT DVNC (INSTRUMENTS) ×1 IMPLANT
NS IRRIG 1000ML POUR BTL (IV SOLUTION) ×1 IMPLANT
OBTURATOR OPTICAL STND 8 DVNC (TROCAR) ×1
OBTURATOR OPTICALSTD 8 DVNC (TROCAR) ×1 IMPLANT
PACK GYN LAPAROSCOPIC (MISCELLANEOUS) ×1 IMPLANT
PAD OB MATERNITY 4.3X12.25 (PERSONAL CARE ITEMS) ×1 IMPLANT
PAD PREP OB/GYN DISP 24X41 (PERSONAL CARE ITEMS) ×1 IMPLANT
SCISSORS MNPLR CVD DVNC XI (INSTRUMENTS) ×1 IMPLANT
SCRUB CHG 4% DYNA-HEX 4OZ (MISCELLANEOUS) ×1 IMPLANT
SEAL UNIV 5-12 XI (MISCELLANEOUS) ×3 IMPLANT
SEALER VESSEL EXT DVNC XI (MISCELLANEOUS) IMPLANT
SET CYSTO W/LG BORE CLAMP LF (SET/KITS/TRAYS/PACK) IMPLANT
SET TUBE SMOKE EVAC HIGH FLOW (TUBING) ×1 IMPLANT
SOL ELECTROSURG ANTI STICK (MISCELLANEOUS) ×1
SOL PREP PVP 2OZ (MISCELLANEOUS) ×1
SOLUTION ELECTROSURG ANTI STCK (MISCELLANEOUS) ×1 IMPLANT
SOLUTION PREP PVP 2OZ (MISCELLANEOUS) ×1 IMPLANT
SURGILUBE 2OZ TUBE FLIPTOP (MISCELLANEOUS) ×1 IMPLANT
SUT MNCRL 4-0 (SUTURE) ×2
SUT MNCRL 4-0 27XMFL (SUTURE) ×2
SUT VIC AB 0 CT2 27 (SUTURE) ×2 IMPLANT
SUT VLOC 90 2/L VL 12 GS22 (SUTURE) IMPLANT
SUTURE MNCRL 4-0 27XMF (SUTURE) ×1 IMPLANT
WATER STERILE IRR 500ML POUR (IV SOLUTION) ×1 IMPLANT

## 2023-05-03 NOTE — Op Note (Signed)
Beth Pittman PROCEDURE DATE: 05/03/2023  PREOPERATIVE DIAGNOSIS: Pelvic pain, dermoid cyst of the right ovary POSTOPERATIVE DIAGNOSIS: The same PROCEDURE:  XI ROBOTIC ASSISTED LAPAROSCOPIC OVARIAN CYSTECTOMY, POSSIBLE RIGHT OOPHORECTOMY: 16109 (CPT) Lysis of adhesions  SURGEON:  Dr. [name], MD ASSISTANT: Beth Pittman, CNM Anesthesiologist:  Anesthesiologist: Piscitello, [name], MD CRNA: [name], CRNA; [name], CRNA; [name], CRNA  INDICATIONS: 37 y.o. F here for definitive surgical management secondary to the indications listed under preoperative diagnoses; please see preoperative note for further details.   Large complex adnexal mass in the right ovary with mass effect, with pain on the left and left back. Dermoid cyst identified on CT and ultrasound  Risks of surgery were discussed with the patient including but not limited to: bleeding which may require transfusion or reoperation; infection which may require antibiotics; injury to bowel, bladder, ureters or other surrounding organs; need for additional procedures; thromboembolic phenomenon, incisional problems and other postoperative/anesthesia complications. Written informed consent was obtained.    FINDINGS:   Pelvic: External genitalia negative for lesions. Vagina negative. Adnexa negative for masses or nodularity. Cervix without gross lesions. Uterus mobile, anteverted, small.   Intraoperative findings revealed a normal upper abdomen including bowel, diaphragmatic surfaces, stomach, and omentum.  The uterus was small and mobile.   The left appeared normal with small follicular cyst.  The left ovary was multi-lobular, with a large dark cyst and no normal ovarian tissue visible.  Midline omental tissue adherent inferior to the umbilicus, which was removed.  ANESTHESIA:    General INTRAVENOUS FLUIDS:700  ml ESTIMATED BLOOD LOSS:10 ml URINE OUTPUT: 300  ml  SPECIMENS: Right ovary with dermoid cyst  COMPLICATIONS: None immediate  PROCEDURE IN DETAIL: After informed consent was obtained, the patient was taken to the operating room where general anesthesia was obtained without difficulty. The patient was positioned in the dorsal lithotomy position in [place] stirrups and her arms were carefully tucked at her sides and the usual precautions were taken. Deep Trendelenburg (20-25 deg) was established to confirm that she does not shift on the table.  She was prepped and draped in normal sterile fashion.  Time-out was performed and a Foley catheter was placed into the bladder. A uterine sound was steri-striped to the single toothed tenaculum at the cervix to use as uterine manipulation.  After infiltration of local anesthetic at the proposed trocar sites and confirmation of an OG tube, a 5mm optiview was placed in the LUQ. Pneumoperitoneum was created to a pressure of 11 mmHg. The abdomen and pelvis was surveyed for the above findings.   A 8mm incision was created infraumbilically, and a robotic 8 port was placed under direct visualization. The camera was placed and the abdomen surveyed, noting intact bowel below the site of entry. A survey of the pelvis and upper abdomen revealed the above findings. Right lateral 8-mm robotic port was placed under direct visualization and a 12mm left port similarly placed.  The patient was placed in deepTrendelenburg and the bowel was displaced up into the upper abdomen. The robot was left side docked. The instruments were placed under direct visualization.   Omental adhesions excised to allow visualization of the pelvis.  The ureters were identified bilaterally coursing outside of the operative field.   Ovariolysis was performed and the right ovary was dissected medially with care to avoid the ureter.  The infundibulopelvic ligaments were skeletonized, sealed and divided, 3cm from the ovary itself.  The  intraperitoneal pressure was dropped, and  all planes of dissection, vascular pedicles  were found to be hemostatic.  The 12mm port fascia was closed with 2 figure of eights using 0 vicryl.  The robot was undocked. The lateral trocars were removed under visualization.  The CO2 gas was released and several deep breaths given to remove any remaining CO2 from the peritoneal cavity.  The skin incisions were closed with 4-0 Monocryl subcuticular stitch and dermabond. Sponge, lap and needle counts were correct x2.   The patient tolerated the procedure well and was taken to the recovery area awake and in stable condition. She received iv acetaminophen and Toradol prior to leaving the OR.   The patient will be discharged to home as per PACU criteria. Routine postoperative instructions given.  She was prescribed Percocet, Ibuprofen and Colace.  She will follow up in the clinic in two weeks for postoperative evaluation.   Anesthesia was reversed without difficulty.

## 2023-05-03 NOTE — Anesthesia Procedure Notes (Signed)
Procedure Name: Intubation Date/Time: 05/03/2023 1:04 PM  Performed by: [name], CRNAPre-anesthesia Checklist: Patient identified, Emergency Drugs available, Suction available and Patient being monitored Patient Re-evaluated:Patient Re-evaluated prior to induction Oxygen Delivery Method: Circle system utilized Preoxygenation: Pre-oxygenation with 100% oxygen Induction Type: IV induction Ventilation: Mask ventilation without difficulty Laryngoscope Size: Mac and 3 Grade View: Grade II Tube type: Oral Tube size: 6.5 mm Number of attempts: 1 Airway Equipment and Method: Stylet and Oral airway Placement Confirmation: ETT inserted through vocal cords under direct vision, positive ETCO2 and breath sounds checked- equal and bilateral Secured at: 21 cm Tube secured with: Tape Dental Injury: Teeth and Oropharynx as per pre-operative assessment

## 2023-05-03 NOTE — Interval H&P Note (Signed)
History and Physical Interval Note:  05/03/2023 11:42 AM  Beth Pittman  has presented today for surgery, with the diagnosis of bilateral dermoid cyst.  The various methods of treatment have been discussed with the patient and family. After consideration of risks, benefits and other options for treatment, the patient has consented to  Procedure(s): XI ROBOTIC ASSISTED LAPAROSCOPIC OVARIAN CYSTECTOMY, POSSIBLE RIGHT OOPHORECTOMY (Bilateral) as a surgical intervention.  The patient's history has been reviewed, patient examined, no change in status, stable for surgery.  I have reviewed the patient's chart and labs.  Questions were answered to the patient's satisfaction.     [name]

## 2023-05-03 NOTE — Discharge Instructions (Addendum)
Laparoscopic Ovarian Surgery Discharge Instructions  For the next three days, take ibuprofen and acetaminophen on a schedule, every 8 hours. You can take them together or you can intersperse them, and take one every four hours. I also gave you gabapentin for nighttime, to help you sleep and also to control pain. Take gabapentin medicines at night for at least the next 3 nights. You also have a narcotic, oxycodone, to take as needed if the above medicines don't help.  Postop constipation is a major cause of pain. Stay well hydrated, walk as you tolerate, and take over the counter senna as well as stool softeners if you need them.   RISKS AND COMPLICATIONS  Infection. Bleeding. Injury to surrounding organs. Anesthetic side effects.   PROCEDURE  You may be given a medicine to help you relax (sedative) before the procedure. You will be given a medicine to make you sleep (general anesthetic) during the procedure. A tube will be put down your throat to help your breath while under general anesthesia. Several small cuts (incisions) are made in the lower abdominal area and one incision is made near the belly button. Your abdominal area will be inflated with a safe gas (carbon dioxide). This helps give the surgeon room to operate, visualize, and helps the surgeon avoid other organs. A thin, lighted tube (laparoscope) with a camera attached is inserted into your abdomen through the incision near the belly button. Other small instruments may also be inserted through other abdominal incisions. The ovary is located and are removed. After the ovary is removed, the gas is released from the abdomen. The incisions will be closed with stitches (sutures), and Dermabond. A bandage may be placed over the incisions.  AFTER THE PROCEDURE  You will also have some mild abdominal discomfort for 3-7 days. You will be given pain medicine to ease any discomfort. As long as there are no problems, you may be allowed to  go home. Someone will need to drive you home and be with you for at least 24 hours once home. You may have some mild discomfort in the throat. This is from the tube placed in your throat while you were sleeping. You may experience discomfort in the shoulder area from some trapped air between the liver and diaphragm. This sensation is normal and will slowly go away on its own.  HOME CARE INSTRUCTIONS  Take all medicines as directed. Only take over-the-counter or prescription medicines for pain, discomfort, or fever as directed by your caregiver. Resume daily activities as directed. Showers are preferred over baths for 2 weeks. You may resume sexual activities in 1 week or as you feel you would like to. Do not drive while taking narcotics.  SEEK MEDICAL CARE IF: . There is increasing abdominal pain. You feel lightheaded or faint. You have the chills. You have an oral temperature above 102 F (38.9 C). There is pus-like (purulent) drainage from any of the wounds. You are unable to pass gas or have a bowel movement. You feel sick to your stomach (nauseous) or throw up (vomit) and can't control it with your medicines.  MAKE SURE YOU:  Understand these instructions. Will watch your condition. Will get help right away if you are not doing well or get worse.  Exit[employer] Patient Information 2013 [place], [place].    AMBULATORY SURGERY  DISCHARGE INSTRUCTIONS   The drugs that you were given will stay in your system until tomorrow so for the next 24 hours you should not:  Drive  an automobile Make any legal decisions Drink any alcoholic beverage   You may resume regular meals tomorrow.  Today it is better to start with liquids and gradually work up to solid foods.  You may eat anything you prefer, but it is better to start with liquids, then soup and crackers, and gradually work up to solid foods.   Please notify your doctor immediately if you have any unusual bleeding, trouble  breathing, redness and pain at the surgery site, drainage, fever, or pain not relieved by medication.      Additional Instructions:

## 2023-05-03 NOTE — Anesthesia Preprocedure Evaluation (Signed)
Anesthesia Evaluation  Patient identified by MRN, date of birth, ID band Patient awake    Reviewed: Allergy & Precautions, NPO status , Patient's Chart, lab work & pertinent test results  History of Anesthesia Complications Negative for: history of anesthetic complications  Airway Mallampati: II  TM Distance: >3 FB Neck ROM: full    Dental  (+) Chipped   Pulmonary neg pulmonary ROS, neg shortness of breath   Pulmonary exam normal        Cardiovascular Exercise Tolerance: Good (-) angina (-) Past MI negative cardio ROS Normal cardiovascular exam     Neuro/Psych  Headaches  negative psych ROS   GI/Hepatic negative GI ROS, Neg liver ROS,neg GERD  ,,  Endo/Other  negative endocrine ROS    Renal/GU      Musculoskeletal   Abdominal   Peds  Hematology negative hematology ROS (+)   Anesthesia Other Findings Past Medical History: No date: Constipation No date: Headache     Comment:  migrains No date: History of abnormal cervical Pap smear No date: History of palpitations     Comment:  rare No date: HLD (hyperlipidemia) No date: Insomnia No date: Teratoma of ovary, right  Past Surgical History: No date: BREAST SURGERY     Comment:  breast reduction x 2 No date: CESAREAN SECTION     Comment:  x1 06/2015: REDUCTION MAMMAPLASTY Bilateral  BMI    Body Mass Index: 32.22 kg/m      Reproductive/Obstetrics negative OB ROS                             Anesthesia Physical Anesthesia Plan  ASA: 2  Anesthesia Plan: General ETT   Post-op Pain Management:    Induction: Intravenous  PONV Risk Score and Plan: Ondansetron, Dexamethasone, Midazolam and Treatment may vary due to age or medical condition  Airway Management Planned: Oral ETT  Additional Equipment:   Intra-op Plan:   Post-operative Plan: Extubation in OR  Informed Consent: I have reviewed the patients History and  Physical, chart, labs and discussed the procedure including the risks, benefits and alternatives for the proposed anesthesia with the patient or authorized representative who has indicated his/her understanding and acceptance.     Dental Advisory Given  Plan Discussed with: Anesthesiologist, CRNA and Surgeon  Anesthesia Plan Comments: (Patient consented for risks of anesthesia including but not limited to:  - adverse reactions to medications - damage to eyes, teeth, lips or other oral mucosa - nerve damage due to positioning  - sore throat or hoarseness - Damage to heart, brain, nerves, lungs, other parts of body or loss of life  Patient voiced understanding.)       Anesthesia Quick Evaluation

## 2023-05-03 NOTE — Transfer of Care (Signed)
Immediate Anesthesia Transfer of Care Note  Patient: Beth Pittman  Procedure(s) Performed: XI ROBOTIC ASSISTED LAPAROSCOPIC OVARIAN CYSTECTOMY, POSSIBLE RIGHT OOPHORECTOMY (Bilateral)  Patient Location: PACU  Anesthesia Type:General  Level of Consciousness: drowsy  Airway & Oxygen Therapy: Patient Spontanous Breathing and Patient connected to face mask oxygen  Post-op Assessment: Report given to RN  Post vital signs: stable  Last Vitals:  Vitals Value Taken Time  BP 121/81 05/03/23 1520  Temp 36 C 05/03/23 1520  Pulse 88 05/03/23 1523  Resp 15 05/03/23 1523  SpO2 100 % 05/03/23 1523  Vitals shown include unvalidated device data.  Last Pain:  Vitals:   05/03/23 1202  TempSrc: Oral         Complications: No notable events documented.

## 2023-05-04 ENCOUNTER — Encounter: Payer: Self-pay | Admitting: Obstetrics and Gynecology

## 2023-05-04 NOTE — Anesthesia Postprocedure Evaluation (Signed)
Anesthesia Post Note  Patient: Beth Pittman  Procedure(s) Performed: XI ROBOTIC ASSISTED LAPAROSCOPIC OVARIAN CYSTECTOMY, POSSIBLE RIGHT OOPHORECTOMY (Bilateral)  Patient location during evaluation: PACU Anesthesia Type: General Level of consciousness: awake and alert Pain management: pain level controlled Vital Signs Assessment: post-procedure vital signs reviewed and stable Respiratory status: spontaneous breathing, nonlabored ventilation, respiratory function stable and patient connected to nasal cannula oxygen Cardiovascular status: blood pressure returned to baseline and stable Postop Assessment: no apparent nausea or vomiting Anesthetic complications: no   No notable events documented.   Last Vitals:  Vitals:   05/03/23 1630 05/03/23 1640  BP: 116/75 126/84  Pulse: 92 96  Resp: 10 14  Temp:  (!) 36.1 C  SpO2: 93% 96%    Last Pain:  Vitals:   05/03/23 1640  TempSrc: Temporal  PainSc: 3                  [name] Pearlene Teat

## 2023-05-04 NOTE — Addendum Note (Signed)
Addendum  created 05/04/23 1244 by [name], CRNA   Flowsheet accepted, Intraprocedure Flowsheets edited

## 2023-05-10 LAB — SURGICAL PATHOLOGY
